# Patient Record
Sex: Female | Born: 1970 | Race: Asian | Hispanic: No | Marital: Married | Smoking: Never smoker
Health system: Southern US, Community
[De-identification: ages and names within clinical notes are randomized; demographics above are authoritative.]

## PROBLEM LIST (undated history)

## (undated) DIAGNOSIS — G8929 Other chronic pain: Secondary | ICD-10-CM

## (undated) DIAGNOSIS — R0789 Other chest pain: Secondary | ICD-10-CM

## (undated) DIAGNOSIS — K649 Unspecified hemorrhoids: Secondary | ICD-10-CM

## (undated) HISTORY — PX: TUBAL LIGATION: SHX77

## (undated) HISTORY — DX: Unspecified hemorrhoids: K64.9

## (undated) HISTORY — PX: HEMORRHOID SURGERY: SHX153

---

## 1997-10-07 ENCOUNTER — Ambulatory Visit (HOSPITAL_COMMUNITY): Admission: RE | Admit: 1997-10-07 | Discharge: 1997-10-07 | Payer: Self-pay | Admitting: *Deleted

## 2002-03-11 ENCOUNTER — Encounter: Payer: Self-pay | Admitting: Obstetrics and Gynecology

## 2002-03-11 ENCOUNTER — Ambulatory Visit (HOSPITAL_COMMUNITY): Admission: RE | Admit: 2002-03-11 | Discharge: 2002-03-11 | Payer: Self-pay | Admitting: Obstetrics and Gynecology

## 2002-03-22 ENCOUNTER — Encounter: Payer: Self-pay | Admitting: Obstetrics and Gynecology

## 2002-03-22 ENCOUNTER — Ambulatory Visit (HOSPITAL_COMMUNITY): Admission: RE | Admit: 2002-03-22 | Discharge: 2002-03-22 | Payer: Self-pay | Admitting: Obstetrics and Gynecology

## 2002-07-30 ENCOUNTER — Inpatient Hospital Stay (HOSPITAL_COMMUNITY): Admission: AD | Admit: 2002-07-30 | Discharge: 2002-08-01 | Payer: Self-pay | Admitting: Obstetrics and Gynecology

## 2004-02-02 ENCOUNTER — Other Ambulatory Visit: Admission: RE | Admit: 2004-02-02 | Discharge: 2004-02-02 | Payer: Self-pay | Admitting: Obstetrics and Gynecology

## 2004-07-18 ENCOUNTER — Inpatient Hospital Stay (HOSPITAL_COMMUNITY): Admission: AD | Admit: 2004-07-18 | Discharge: 2004-07-18 | Payer: Self-pay | Admitting: Obstetrics and Gynecology

## 2004-07-19 ENCOUNTER — Inpatient Hospital Stay (HOSPITAL_COMMUNITY): Admission: AD | Admit: 2004-07-19 | Discharge: 2004-07-21 | Payer: Self-pay | Admitting: Obstetrics and Gynecology

## 2004-07-20 ENCOUNTER — Encounter (INDEPENDENT_AMBULATORY_CARE_PROVIDER_SITE_OTHER): Payer: Self-pay | Admitting: Specialist

## 2005-10-19 ENCOUNTER — Other Ambulatory Visit: Admission: RE | Admit: 2005-10-19 | Discharge: 2005-10-19 | Payer: Self-pay | Admitting: Obstetrics and Gynecology

## 2008-07-23 ENCOUNTER — Encounter: Admission: RE | Admit: 2008-07-23 | Discharge: 2008-07-23 | Payer: Self-pay | Admitting: Obstetrics and Gynecology

## 2009-11-11 ENCOUNTER — Encounter: Admission: RE | Admit: 2009-11-11 | Discharge: 2009-11-11 | Payer: Self-pay | Admitting: Obstetrics and Gynecology

## 2010-07-02 NOTE — Discharge Summary (Signed)
[name], [name]                        ACCOUNT NO.:  [id number]   MEDICAL RECORD NO.:  [id number]                   PATIENT TYPE:  INP   LOCATION:  9115                                 [facility]   PHYSICIAN:  [name], M.D.              DATE OF BIRTH:  02/23/1970   DATE OF ADMISSION:  07/30/2002  DATE OF DISCHARGE:  08/01/2002                                 DISCHARGE SUMMARY   HISTORY AND HOSPITAL COURSE:  A 40 year old Asian married female, para 2-0-1-  2 gravida 4; last period October 29, 2001; [name] August 06, 2002 by dates and  August 07, 2002 by ultrasound; admitted for induction of labor.  Blood group  and type AB positive, negative antibody, nonreactive serology, rubella  immune, hepatitis B surface antigen negative, HIV declined, GC and chlamydia  negative, triple screen normal, one-hour Glucola 79, group B strep positive.  Vaginal ultrasound on January 01, 2002:  Crown-rump length 2.21 cm, eight  weeks six days; [name] August 07, 2002.  Repeat ultrasound on March 11, 2002:  Average gestational age [redacted] weeks two days; [name] August 03, 2002.  Echogenic  intracardiac focus was noted in Jenna left ventricle.  Jenna patient had some  spotting on March 22, 2002 but ultrasound showed no placental  abnormalities.  Jenna patient requested C section because of hemorrhoids with  her last delivery.  I advised her that was not an indication for C section.  On July 29, 2002 Jenna cervix was 2+ cm dilated, 70% effaced, and Jenna vertex  was at a -2.  Jenna patient accepted Jenna offer for induction.  She actually  contracted some during Jenna night prior to admission.  Her past medical  history revealed no known allergies, no operations, usual childhood  diseases.  Family history was negative.  Alcohol, tobacco, and drugs none.  Obstetric history:  In March 1992 she delivered a 6 pound 8 ounce female  infant vaginally with no complications.  In 1994 a spontaneous abortion.  In  1996 a 7 pound 8  ounce female vaginally with a fast labor.  Physical exam on  admission revealed normal vital signs.  Jenna abdomen was soft, fundal height  38 cm.  On July 29, 2002 fetal heart tones were normal.  Jenna cervix was 3+  cm, 80%, vertex at a -2.  Artificial rupture of Jenna membranes produced clear  fluid.  Impressions on admission:  Intrauterine pregnancy at 39 weeks,  positive group B strep.  Jenna patient was placed on penicillin.  She never  received Pitocin but entered active labor.  She received an epidural and  reached 10 cm dilatation, vertex, ROA.  She pushed well and delivered  spontaneously ROA over an intact perineum, a living female infant, 6 pounds 15  ounces, Apgars of 8 at one and 9 at five minutes.  [name] was in  attendance.  Jenna placenta was intact,  uterus normal.  There were two loops  of nuchal cord.  Blood loss about 400 mL.  Postpartum Jenna patient did well  and was discharged on postpartum day #2.  Initial hemoglobin 11.9;  hematocrit 35.6; white count 10,700; platelet count 184,000.  Follow-up  hemoglobin 10.3; hematocrit 30.8.  RPR was nonreactive.   FINAL DIAGNOSIS:  Intrauterine pregnancy at 39 weeks delivered right occiput  anterior.   OPERATION:  Spontaneous delivery ROA.   FINAL CONDITION:  Improved.   INSTRUCTIONS:  1. Include our regular discharge instruction booklet.  2. Jenna patient is advised to return to Jenna office in six weeks.  She is to     call with any problems.  3. Percocet 5/325 #20 tablets q.4-6h. as needed for pain is given at     discharge.                                               [name], M.D.   TFH/MEDQ  D:  08/01/2002  T:  08/01/2002  Job:  [id number]

## 2010-07-02 NOTE — Discharge Summary (Signed)
NAMEJEFFREY, VOTH              ACCOUNT NO.:  [id number]   MEDICAL RECORD NO.:  [id number]          PATIENT TYPE:  INP   LOCATION:  9126                          [facility]   PHYSICIAN:  Huel Cote, M.D. DATE OF BIRTH:  03-28-1970   DATE OF ADMISSION:  07/19/2004  DATE OF DISCHARGE:                                 DISCHARGE SUMMARY   DISCHARGE DIAGNOSES:  1.  Term pregnancy at 37 and five-sevenths weeks, delivered.  2.  Induction of labor given severe hemorrhoid pain.  3.  Status post normal spontaneous vaginal delivery.  4.  Status post postpartum tubal ligation.   DISCHARGE MEDICATIONS:  1.  Percocet one to two tablets p.o. q.4h. p.r.n.  2.  Motrin 600 milligrams p.o. q.6 h.  3.  Colace 100 milligrams p.o. b.i.d.  4.  Other medicines and ointments as given by Dr. Derrell Lolling for her      hemorrhoids.   PRENATAL AND HOSPITAL COURSE:  The patient is a 40 year old G5 P 3-0-1-3 who  was admitted at 49 and five-sevenths weeks for induction of labor given  ongoing issues with severe hemorrhoid pain. The patient had been followed by  general surgery which had provided all the possible palliative care they  could and had no other contributions to make for the patient. The patient  was well dated and favorable and therefore it was felt to ameliorate her  issues with severe hemorrhoids we would induce labor to provide relief.  Prenatal care was complicated only advanced maternal age with a normal  triple screen and ultrasound. The patient declined amniocentesis. She also  desired a tubal ligation after delivery. Prenatal labs were as follows:  AB  positive, antibody negative, RPR nonreactive, rubella immune, hepatitis B  surface antigen negative, HIV negative, GC negative, chlamydia negative,  group B strep negative, 1-hour Glucola 56.   PAST OBSTETRICAL HISTORY:  In 1992 she had a 6-pound 8-ounce vaginal  delivery. In 1994 she had spontaneous loss. In 1996 she had a 7-pound  8-  ounce vaginal delivery. In 2004 she had a 6-pound 15-ounce vaginal delivery.   PAST GYNECOLOGICAL HISTORY:  None.   PAST SURGICAL HISTORY:  None.   PAST MEDICAL HISTORY:  Significant only for her severe hemorrhoids which  were problematic last pregnancy and for which she had seen Dr. Orson Slick with  several I&D's of thrombosed hemorrhoids this pregnancy.   On admission, she was afebrile with stable vital signs. Fetal heart rate was  reactive. Cervix was 50 and 5+ centimeters dilated and a -2 station. She had  rupture of membranes performed with clear fluid noted and was placed on  Pitocin. The patient received an epidural early given her issues with  hemorrhoids, and progressed to complete dilation. She pushed for  approximately 30 minutes bringing the vertex to +2 station. The fetal lie  was noted be ROP and the patient was making slow progress with her pushing.  Fetal heart rate did have moderate decelerations to the 70s with each push  which had some delayed recovery and secondary to this, as well as her issue  of  severe hemorrhoids and the desire to not exacerbate her problem, she was  counseled and we decided to proceed with a a vacuum-assisted vaginal  delivery. She was delivered with an Mityvac soft cup vacuum without  difficulty and had a viable female infant, Apgars were 8 and 9, weight was 5  pounds 14 ounces. The placenta delivered spontaneously. Cervix, perineum,  vagina and rectum were all intact. Hemorrhoids were still prevalent but were  unchanged after delivery. The patient desired to proceed with her tubal  ligation and therefore this was posted for postpartum day #1. She underwent  that procedure without difficulty and was then remained in-house an  additional day. On postpartum day #2 she was doing very well. Her  hemorrhoids were stable and she was managing them with her p.o. pain  medications and ointment. Her incision pain was minimal and her incision was  clear  from her tubal ligation. Postpartum hemoglobin was 10.3. She had no  other issues and therefore was felt stable for discharge home. She was  discharged with Percocet and Colace, as well as some ointment prescriptions  from Dr. Derrell Lolling and Dr. Orson Slick, and will follow up with them regarding her  hemorrhoid issues.       KR/MEDQ  D:  07/21/2004  T:  07/21/2004  Job:  [id number]

## 2010-07-02 NOTE — Consult Note (Signed)
[name], [name]              ACCOUNT NO.:  [id number]   MEDICAL RECORD NO.:  [id number]          PATIENT TYPE:  MAT   LOCATION:  MATC                          [facility]   PHYSICIAN:  Angelia Mould. Derrell Lolling, M.D.DATE OF BIRTH:  01/02/1971   DATE OF CONSULTATION:  07/18/2004  DATE OF DISCHARGE:                                   CONSULTATION   REASON FOR CONSULTATION:  Hemorrhoids.   HISTORY OF PRESENT ILLNESS:  This is a 40 year old [place] female with  history of multiparity.  She is about [redacted] weeks pregnant.  She came to the  [facility] maternity admission area this morning complaining of  hemorrhoids.  Apparently she has had problems with her hemorrhoids during  the third trimester of her pregnancy.  She saw Dr. Lebron Conners last week  in the office and apparently had some thrombosed hemorrhoids and underwent  incision and drainage.  She still has pain.  She has not been having a bowel  movement intentionally.  She is intentionally avoiding eating to avoid  having a bowel movement.  She has intentionally withheld bowel movements for  fear that it will cause pain.  She really has not had any vomiting.  She  does not have any abdominal pain.  She does not have any voiding problems.  She has not had any bleeding.   She was seen by Dr. Tracey Harries this morning and he asked if I could do  anything to try to give her some relief and so I came to the maternity  admission unit for examination.   PAST MEDICAL HISTORY:  Apparently she has had two prior pregnancies and this  is her third.  She is otherwise healthy.  She had the usual childhood  diseases.  She has not had any operations.  No other hospitalizations.   CURRENT MEDICATIONS:  None.   ALLERGIES:  None known.   REVIEW OF SYSTEMS:  Also systems were reviewed.  They are noncontributory  except as described above.   PHYSICAL EXAMINATION:  GENERAL:  Pleasant, young, [place] female in mild to  moderate distress.   She is cooperative and oriented.  VITAL SIGNS:  Temperature 98, pulse 90, respirations 18, blood pressure  120/61.  NECK:  No adenopathy or masses.  HEART:  Regular rate and rhythm, no murmur.  Radial and femoral pulses are  palpable.  No peripheral edema.  LUNGS:  Clear to auscultation.  No chest wall tenderness.  ABDOMEN:  Soft, nontender.  Gravid.  Bowel sounds present.  RECTAL:  The patient has stage IV internal and external hemorrhoids.  There  are some wounds suggesting recent incision and drainage of thrombosed  hemorrhoids but there is no significant thrombosis at this time.  Mostly  just edema.  There are no signs of any infection or cellulitis.  There is no  active bleeding.  Digital rectal exam reveals a normal sphincter tone and  some hard stool up above.   TREATMENT:  After giving the patient informed consent, the perianal area was  prepped with alcohol.  I injected 1% Xylocaine with epinephrine (9 mL)  mixed  with Wydase (1 mL) into the internal hemorrhoid component and some Xylocaine  into the external component.  With massage, this allowed significant  reduction in the edema and I was able to achieve complete reduction of both  the internal and external component.  She tolerated this very well and had  complete relief of pain.   ASSESSMENT:  1.  Stage IV hemorrhoids, injected today.  2.  History of thrombosed hemorrhoids.  3.  Third trimester intrauterine pregnancy.   PLAN:  1.  The patient will be discharged home.  2.  She will be advised to stay off her feet mostly with bed test with      bathroom privileges to reduce the venous tone in her hemorrhoids.  3.  She will be given MiraLax 17 g in the ER and she will be given a      prescription for MiraLax to take 17 g a day dissolved in water for seven      days to correct her constipation.   She will be asked to return to see [place] p.r.n. for further hemorrhoid  problems.       HMI/MEDQ  D:  07/18/2004  T:   07/19/2004  Job:  [id number]   cc:   Malachi Pro. Ambrose Mantle, M.D.  [address]  [address]  [place]  [place] [address]  Fax: [phone]

## 2010-07-02 NOTE — Op Note (Signed)
NAMEJUAN, [name]              ACCOUNT NO.:  [id number]   MEDICAL RECORD NO.:  [id number]          PATIENT TYPE:  INP   LOCATION:  9126                          [facility]   PHYSICIAN:  Huel Cote, M.D. DATE OF BIRTH:  11-08-1970   DATE OF PROCEDURE:  07/20/2004  DATE OF DISCHARGE:                                 OPERATIVE REPORT   PREOPERATIVE DIAGNOSES:  1.  Desires sterility.  2.  Status post normal spontaneous vaginal delivery.   POSTOPERATIVE DIAGNOSES:  1.  Desires sterility.  2.  Status post normal spontaneous vaginal delivery.   PROCEDURE:  Postpartum bilateral tubal ligation.   SURGEON:  Dr. Huel Cote.   ANESTHESIA:  Epidural.   FINDINGS:  There is a normal uterus, ovaries and tubes noted.  The patient  did have a very full bladder at the procedure, which was straight  catheterized afterwards for 1 L.   ESTIMATED BLOOD LOSS:  Minimal.   PROCEDURE:  The patient was taken to the operating room, where epidural  anesthesia was found to be adequate by Allis clamp test.  She was then  prepped and draped in the normal sterile fashion in the dorsal supine  position.  An infraumbilical incision was just made with the scalpel and  then carried through to the underlying layer of fascia by sharp dissection  and the fascia opened.  The peritoneal cavity was then entered bluntly.  The  [employer] retractors were placed within the incision and a very full bladder  noted, which was approaching the umbilicus.  This was deviated to the side  and the left fallopian tube identified, traced out to its fimbriated end and  grasped with Babcock clamp.  An approximately 2-3 cm segment of tube was  then tied off with two free ties of 0 plain and the tubal portion amputated.  The free pedicles were then additionally cauterized with Bovie cautery.  The  ovary on that side appeared normal.  Attention was then turned to the right  fallopian tube, which in a similar fashion  was identified, traced out to its  fimbriated, and an approximately 2-3 cm segment of tube tied off with two  free ties of 0 plain.  This was then amputated and the free pedicles also  treated with Bovie cautery.  This was found  to be hemostatic and thus  returned to the abdomen.  There was no active bleeding noted.  Therefore,  the fascia was closed with 0 Vicryl in a  running fashion and the skin was closed with 3-0 Vicryl in a subcuticular  stitch.  Sponge, lap and needle counts were correct x2, and the patient was  taken to the recovery room in stable condition.  She was straight  catheterized prior to leaving the OR of approximately 1 L of urine.       KR/MEDQ  D:  07/20/2004  T:  07/20/2004  Job:  [id number]

## 2010-07-02 NOTE — Op Note (Signed)
[name], [name]              ACCOUNT NO.:  [id number]   MEDICAL RECORD NO.:  [id number]          PATIENT TYPE:  INP   LOCATION:  9126                          [facility]   PHYSICIAN:  Huel Cote, M.D. DATE OF BIRTH:  17-Apr-1970   DATE OF PROCEDURE:  07/19/2004  DATE OF DISCHARGE:                                 OPERATIVE REPORT   INDICATIONS FOR PROCEDURE:  The patient is a 40 year old gravida 4, para 3,  who was induced, given an ongoing problem with grade IV hemorrhoids and  obtaining really no relief with general surgeons having no other palliative  care to offer.  She was induced at approximately [redacted] weeks gestation and had  a very favorable cervix.  She reached complete dilation and pushed for  approximately 30 minutes and brought the vertex to a +2 station.  Presentation was ROP.  At this point, the fetal heart rate had moderate  decelerations to the 70's with each push and there was some delay in  recovery with the decelerations over time.  Also we had a prearranged plan  to try to diminish her pushing time given her severe hemorrhoids in hopes  that this would not exacerbate her problem.  At this point, the patient was  counseled and the decision was made to proceed with a vacuum assisted  vaginal delivery.   DESCRIPTION OF PROCEDURE:  The Mity-Vac soft cup vacuum was applied to the  vertex at a +2 position, ROP presentation.  One popoff occurred after the  first application and the vacuum was reapplied.  The baby then delivered  with two easy pulls over intact perineum.  Apgars were 8 and 9.  Weight was  pending after delivery.  The placenta delivered spontaneously.  The baby was  noted to be a direct ROP presentation on delivery.  Cervix, perineum,  vagina, and rectum were all intact.  Her hemorrhoids were still prevalent  after delivery, but appeared unchanged and she was left to recover in the  LDR room in stable condition.       KR/MEDQ  D:   07/19/2004  T:  07/19/2004  Job:  [id number]

## 2011-06-09 ENCOUNTER — Other Ambulatory Visit: Payer: Self-pay | Admitting: Obstetrics and Gynecology

## 2011-06-09 DIAGNOSIS — Z1231 Encounter for screening mammogram for malignant neoplasm of breast: Secondary | ICD-10-CM

## 2011-06-22 ENCOUNTER — Ambulatory Visit
Admission: RE | Admit: 2011-06-22 | Discharge: 2011-06-22 | Disposition: A | Discharge status: Home / Self Care | Payer: BC Managed Care – PPO | Source: Ambulatory Visit | Attending: Obstetrics and Gynecology | Admitting: Obstetrics and Gynecology

## 2011-06-22 DIAGNOSIS — Z1231 Encounter for screening mammogram for malignant neoplasm of breast: Secondary | ICD-10-CM

## 2012-09-21 ENCOUNTER — Other Ambulatory Visit: Payer: Self-pay

## 2012-09-21 DIAGNOSIS — Z1231 Encounter for screening mammogram for malignant neoplasm of breast: Secondary | ICD-10-CM

## 2012-10-08 ENCOUNTER — Ambulatory Visit
Admission: RE | Admit: 2012-10-08 | Discharge: 2012-10-08 | Disposition: A | Discharge status: Home / Self Care | Payer: BC Managed Care – PPO | Source: Ambulatory Visit

## 2012-10-08 DIAGNOSIS — Z1231 Encounter for screening mammogram for malignant neoplasm of breast: Secondary | ICD-10-CM

## 2012-10-10 ENCOUNTER — Other Ambulatory Visit: Payer: Self-pay | Admitting: Family Medicine

## 2012-10-10 DIAGNOSIS — R928 Other abnormal and inconclusive findings on diagnostic imaging of breast: Secondary | ICD-10-CM

## 2012-11-05 ENCOUNTER — Ambulatory Visit
Admission: RE | Admit: 2012-11-05 | Discharge: 2012-11-05 | Disposition: A | Discharge status: Home / Self Care | Payer: BC Managed Care – PPO | Source: Ambulatory Visit | Attending: Family Medicine | Admitting: Family Medicine

## 2012-11-05 DIAGNOSIS — R928 Other abnormal and inconclusive findings on diagnostic imaging of breast: Secondary | ICD-10-CM

## 2013-05-21 ENCOUNTER — Ambulatory Visit (INDEPENDENT_AMBULATORY_CARE_PROVIDER_SITE_OTHER): Payer: BC Managed Care – PPO | Admitting: General Surgery

## 2014-03-01 IMAGING — MG MM DIAGNOSTIC LTD RIGHT
2 series · 2 of 2 positions shown · non-contrast
Comparison: Multiple priors

ACR Breast Density Category b: There is a scattered fibroglandular
pattern.

CLINICAL DATA: Abnormal screening mammogram.

EXAM:
DIGITAL DIAGNOSTIC RIGHT MAMMOGRAM with CAD

[R MLO]
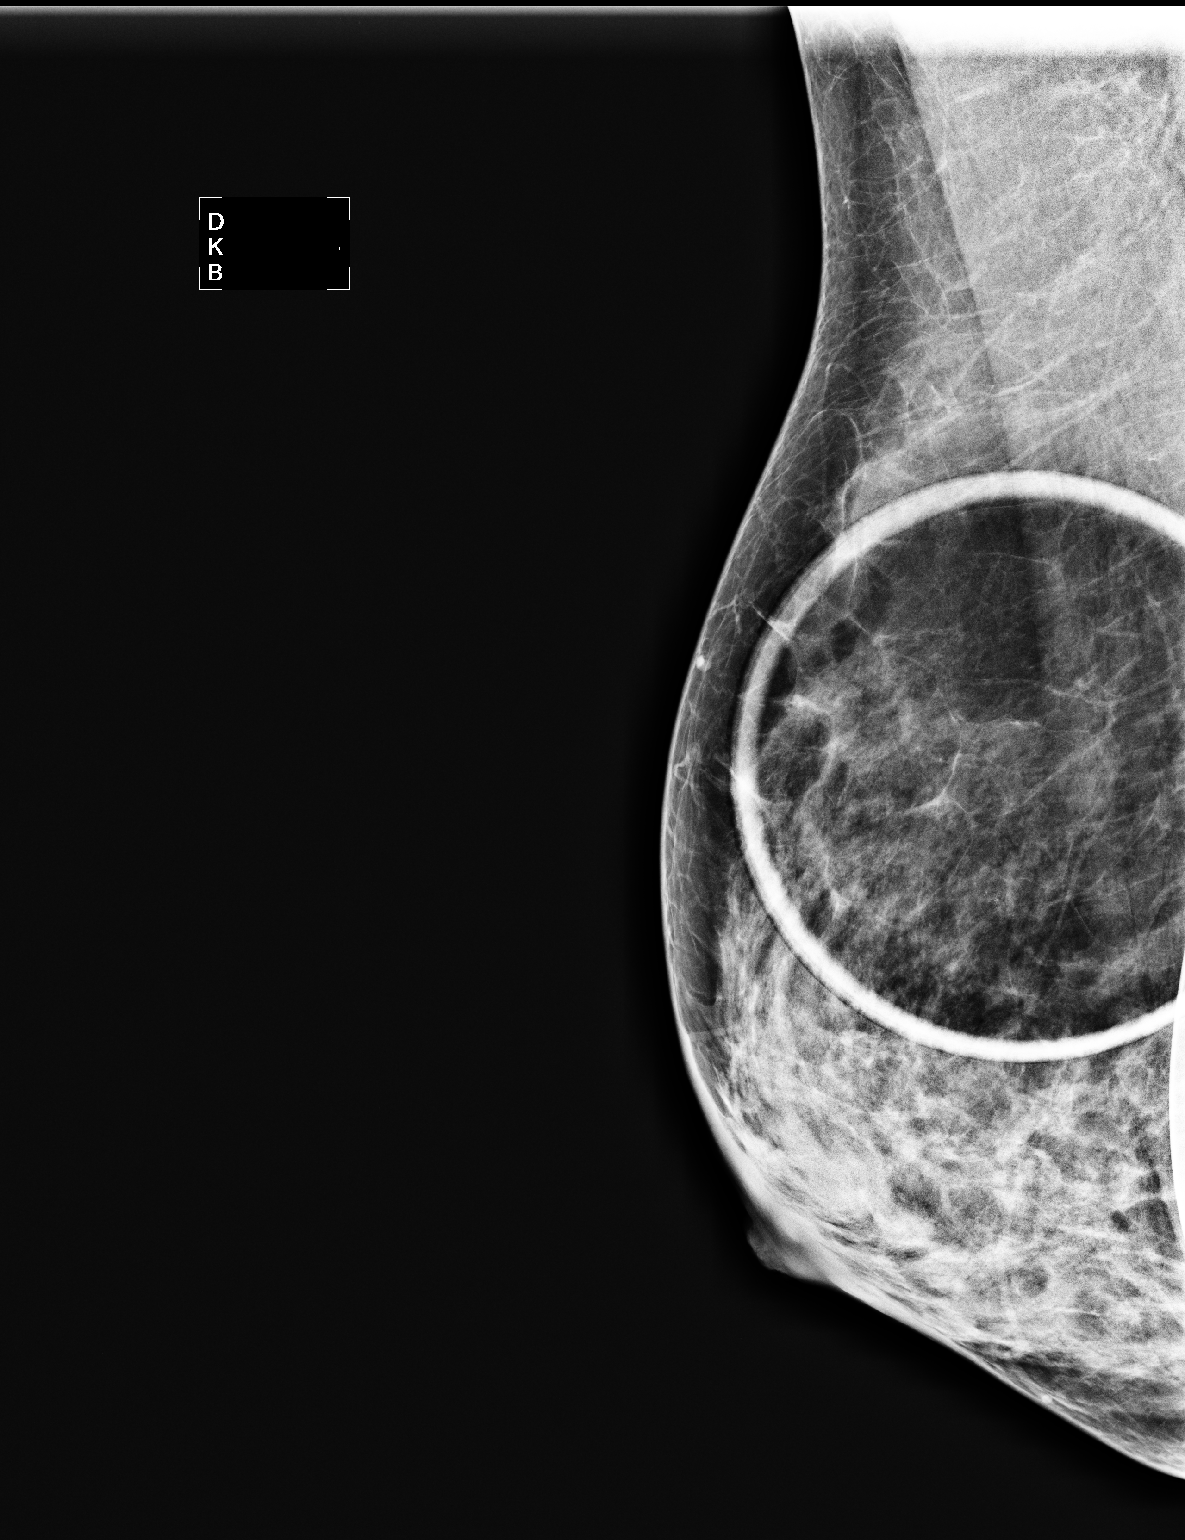

[R ML]
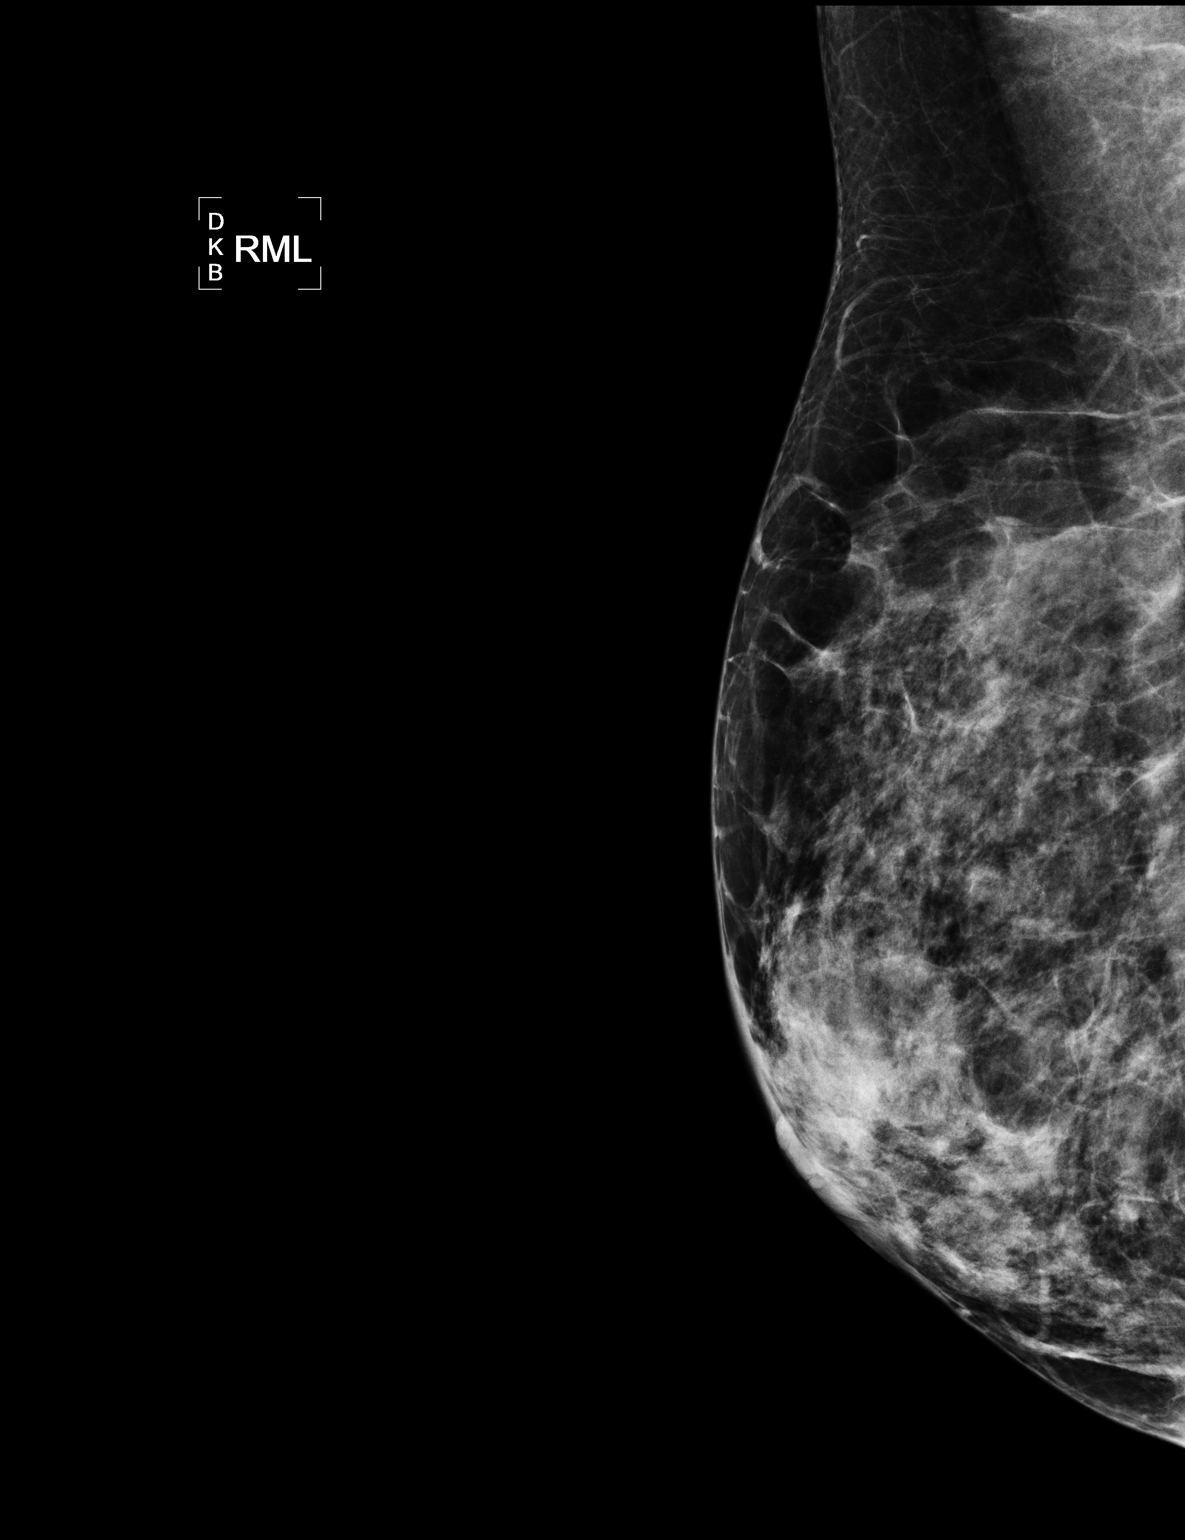

[2 of 2 positions shown; findings below may reference images not displayed]

FINDINGS: ML view in addition to a spot compression MLO view of the right
breast were obtained. The previously questioned asymmetry in the
upper right breast disperses with additional imaging.

Mammographic images were processed with CAD.
IMPRESSION: Negative examination

RECOMMENDATION:
Screening mammogram in one year.(Code:[id number]-[id number])

I have discussed the findings and recommendations with the patient.
Results were also provided in writing at the conclusion of the
visit. If applicable, a reminder letter will be sent to the patient
regarding the next appointment.

BI-RADS CATEGORY  1: Negative

## 2014-04-09 ENCOUNTER — Encounter: Payer: Self-pay | Admitting: Nurse Practitioner

## 2014-04-15 ENCOUNTER — Ambulatory Visit: Payer: Self-pay | Admitting: Nurse Practitioner

## 2014-04-16 ENCOUNTER — Encounter: Payer: Self-pay | Admitting: Gastroenterology

## 2014-04-16 ENCOUNTER — Ambulatory Visit (INDEPENDENT_AMBULATORY_CARE_PROVIDER_SITE_OTHER): Payer: Medicaid Other | Admitting: Gastroenterology

## 2014-04-16 VITALS — BP 110/76 | HR 64 | Ht 65.0 in | Wt 142.1 lb

## 2014-04-16 DIAGNOSIS — K644 Residual hemorrhoidal skin tags: Secondary | ICD-10-CM

## 2014-04-16 DIAGNOSIS — K648 Other hemorrhoids: Secondary | ICD-10-CM

## 2014-04-16 NOTE — Patient Instructions (Addendum)
You have been scheduled for an appointment with Dr.  Derrell Lollingamirez  at [facility]. Your appointment is on 04/28/14 at 2:30pm. Please arrive at 2:00pm for registration. Make certain to bring a list of current medications, including any over the counter medications or vitamins. Also bring your co-pay if you have one as well as your insurance cards. [facility] is located at [address], [address]. Should you need to reschedule your appointment, please contact them at [phone].   I appreciate the opportunity to care for you.

## 2014-04-16 NOTE — Progress Notes (Signed)
     04/16/2014 [name] [id number] [birth year]   HISTORY OF PRESENT ILLNESS:  Patient is a pleasant 44 year old [place] female who is new to our practice and is here today to discuss her hemorrhoid issues.  She says that in 02/2011 she had a hemorrhoid surgery in [place] and was having rectal bleeding from them at that time; she is not sure exactly what was performed.  She says that they seemed to be gone for a while, but now they seem to be back again and are constantly present.  She has not experienced any bleeding, however, and are just a nuisance so she would like to see about having them removed.  Denies any other GI complaints.   Past Medical History  Diagnosis Date  . Hemorrhoids    Past Surgical History  Procedure Laterality Date  . Hemorrhoid surgery    . Tubal ligation      reports that she has never smoked. She has never used smokeless tobacco. She reports that she does not drink alcohol or use illicit drugs. family history is not on file. No Known Allergies   No outpatient encounter prescriptions on file as of 04/16/2014.    REVIEW OF SYSTEMS  : All other systems reviewed and negative except where noted in the History of Present Illness.   PHYSICAL EXAM: BP 110/76 mmHg  Pulse 64  Ht 5\' 5"  (1.651 m)  Wt 142 lb 2 oz (64.467 kg)  BMI 23.65 kg/m2  LMP 04/10/2014 General: Well developed female in no acute distress Head: Normocephalic and atraumatic Eyes:  Sclerae anicteric, conjunctiva pink. Ears: Normal auditory acuity. Abdomen: Soft, non-distended.  BS present.  Non-tender. Rectal:  External hemorrhoids noted, non-inflamed and non-bleeding.  DRE did not reveal any masses; light brown, heme negative stool on exam glove.  Anoscopy did not reveal any significant internal hemorrhoids. Musculoskeletal: Symmetrical with no gross deformities  Skin: No lesions on visible extremities Extremities: No edema  Neurological: Alert oriented x 4, grossly  non-focal Psychological:  Alert and cooperative. Normal mood and affect  ASSESSMENT AND PLAN: -External hemorrhoids:  Seen on exam.  No significant internal hemorrhoids identified.  Will refer to [facility] for evaluation as patient would like to consider removal.

## 2014-04-17 NOTE — Progress Notes (Signed)
I agree with the above note, plan 

## 2016-03-13 ENCOUNTER — Emergency Department (HOSPITAL_BASED_OUTPATIENT_CLINIC_OR_DEPARTMENT_OTHER): Payer: BLUE CROSS/BLUE SHIELD

## 2016-03-13 ENCOUNTER — Emergency Department (HOSPITAL_BASED_OUTPATIENT_CLINIC_OR_DEPARTMENT_OTHER)
Admission: EM | Admit: 2016-03-13 | Discharge: 2016-03-13 | Disposition: A | Discharge status: Home / Self Care | Payer: BLUE CROSS/BLUE SHIELD | Attending: Emergency Medicine | Admitting: Emergency Medicine

## 2016-03-13 ENCOUNTER — Encounter (HOSPITAL_BASED_OUTPATIENT_CLINIC_OR_DEPARTMENT_OTHER): Payer: Self-pay | Admitting: *Deleted

## 2016-03-13 DIAGNOSIS — R0789 Other chest pain: Secondary | ICD-10-CM

## 2016-03-13 DIAGNOSIS — R072 Precordial pain: Secondary | ICD-10-CM | POA: Diagnosis present

## 2016-03-13 HISTORY — DX: Other chest pain: R07.89

## 2016-03-13 HISTORY — DX: Other chronic pain: G89.29

## 2016-03-13 LAB — COMPREHENSIVE METABOLIC PANEL
ALBUMIN: 3.9 g/dL (ref 3.5–5.0)
ALT: 68 U/L — ABNORMAL HIGH (ref 14–54)
ANION GAP: 7 (ref 5–15)
AST: 42 U/L — AB (ref 15–41)
Alkaline Phosphatase: 63 U/L (ref 38–126)
BUN: 14 mg/dL (ref 6–20)
CHLORIDE: 108 mmol/L (ref 101–111)
CO2: 25 mmol/L (ref 22–32)
Calcium: 9.1 mg/dL (ref 8.9–10.3)
Creatinine, Ser: 0.5 mg/dL (ref 0.44–1.00)
GFR calc Af Amer: >60 mL/min (ref >60)
GFR calc non Af Amer: >60 mL/min (ref >60)
GLUCOSE: 94 mg/dL (ref 65–99)
POTASSIUM: 3.9 mmol/L (ref 3.5–5.1)
SODIUM: 140 mmol/L (ref 135–145)
Total Bilirubin: 1.2 mg/dL (ref 0.3–1.2)
Total Protein: 6.7 g/dL (ref 6.5–8.1)

## 2016-03-13 LAB — CBC
HEMATOCRIT: 41.5 % (ref 36.0–46.0)
Hemoglobin: 14.1 g/dL (ref 12.0–15.0)
MCH: 29.2 pg (ref 26.0–34.0)
MCHC: 34 g/dL (ref 30.0–36.0)
MCV: 85.9 fL (ref 78.0–100.0)
PLATELETS: 247 10*3/uL (ref 150–400)
RBC: 4.83 MIL/uL (ref 3.87–5.11)
RDW: 12.3 % (ref 11.5–15.5)
WBC: 6.1 10*3/uL (ref 4.0–10.5)

## 2016-03-13 LAB — TROPONIN I: Troponin I: <0.03 ng/mL (ref <0.03)

## 2016-03-13 MED ORDER — IBUPROFEN 400 MG PO TABS
600.0000 mg | ORAL_TABLET | Freq: Once | ORAL | Status: AC
  Administered 2016-03-13: 600 mg via ORAL
  Filled 2016-03-13: qty 1

## 2016-03-13 MED ORDER — IBUPROFEN 600 MG PO TABS: 600.0000 mg | ORAL_TABLET | Freq: Four times a day (QID) | ORAL | 0 refills | Status: AC | PRN

## 2016-03-13 NOTE — ED Notes (Signed)
Pt on cardiac monitor, continuous pulse ox and auto VS 

## 2016-03-13 NOTE — ED Notes (Signed)
Pt in X-Ray ?

## 2016-03-13 NOTE — ED Triage Notes (Signed)
Patient states she developed central chest pain with radiation into the upper back.  Pain is associated with headache.

## 2016-03-13 NOTE — ED Provider Notes (Signed)
[facility] Provider Note   CSN: [id number] Arrival date & time: 03/13/16  1218     History   Chief Complaint Chief Complaint  Patient presents with  . Chest Pain    HPI [name] is a 46 y.o. female.  The history is provided by the patient.  Chest Pain   This is a recurrent problem. The problem occurs constantly. The problem has not changed since onset.Associated with: deep breathing. The pain is present in the substernal region. The pain is mild. The quality of the pain is described as pressure-like. The pain radiates to the upper back. Duration of episode(s) is 2 days. The symptoms are aggravated by deep breathing. Pertinent negatives include no abdominal pain, no cough, no diaphoresis, no dizziness, no exertional chest pressure, no fever and no shortness of breath. Treatments tried: ibuprofen. The treatment provided moderate relief. There are no known risk factors.    Past Medical History:  Diagnosis Date  . Chest wall pain, chronic   . Hemorrhoids     Patient Active Problem List   Diagnosis Date Noted  . External hemorrhoids 04/16/2014    Past Surgical History:  Procedure Laterality Date  . HEMORRHOID SURGERY    . TUBAL LIGATION      OB History    No data available       Home Medications    Prior to Admission medications   Not on File    Family History No family history on file.  Social History Social History  Substance Use Topics  . Smoking status: Never Smoker  . Smokeless tobacco: Never Used  . Alcohol use No     Allergies   Patient has no known allergies.   Review of Systems Review of Systems  Constitutional: Negative for diaphoresis and fever.  Respiratory: Negative for cough and shortness of breath.   Cardiovascular: Positive for chest pain.  Gastrointestinal: Negative for abdominal pain.  Neurological: Negative for dizziness.     Physical Exam Updated Vital Signs BP 130/85   Pulse 107   Resp 18   Ht 5'  5" (1.651 m)   Wt 137 lb (62.1 kg)   LMP 03/06/2016 (Exact Date)   SpO2 96%   BMI 22.80 kg/m   Physical Exam  Constitutional: She is oriented to person, place, and time. She appears well-developed and well-nourished. No distress.  HENT:  Head: Normocephalic.  Nose: Nose normal.  Eyes: Conjunctivae are normal.  Neck: Neck supple. No tracheal deviation present.  Cardiovascular: Normal rate, regular rhythm and normal heart sounds.   Pulmonary/Chest: Effort normal and breath sounds normal. No respiratory distress. She has no wheezes. She has no rales. She exhibits tenderness (anterior).  Abdominal: Soft. She exhibits no distension. There is no tenderness. There is no guarding.  Neurological: She is alert and oriented to person, place, and time.  Skin: Skin is warm and dry. Capillary refill takes less than 2 seconds.  Psychiatric: She has a normal mood and affect.  Vitals reviewed.    ED Treatments / Results  Labs (all labs ordered are listed, but only abnormal results are displayed) Labs Reviewed  COMPREHENSIVE METABOLIC PANEL - Abnormal; Notable for the following:       Result Value   AST 42 (*)    ALT 68 (*)    All other components within normal limits  CBC  TROPONIN I    EKG  EKG Interpretation  Date/Time:  Sunday March 13 2016 12:25:55 EST Ventricular Rate:  111 PR Interval:  114 QRS Duration: 78 QT Interval:  322 QTC Calculation: 437 R Axis:   105 Text Interpretation:  Sinus tachycardia Rightward axis No previous tracing Confirmed by Raela Bohl MD, Zeva Leber [phone]([id number]) on 03/13/2016 1:51:28 PM       Radiology Dg Chest 2 View  Result Date: 03/13/2016 CLINICAL DATA:  Subdural chest pain. EXAM: CHEST  2 VIEW COMPARISON:  None. FINDINGS: The heart size and mediastinal contours are within normal limits. Both lungs are clear. The visualized skeletal structures are unremarkable. IMPRESSION: No active cardiopulmonary disease. Electronically Signed   By: Ted Mcalpineobrinka  Dimitrova M.D.    On: 03/13/2016 13:31    Procedures Procedures (including critical care time)  Medications Ordered in ED Medications  ibuprofen (ADVIL,MOTRIN) tablet 600 mg (not administered)     Initial Impression / Assessment and Plan / ED Course  I have reviewed the triage vital signs and the nursing notes.  Pertinent labs & imaging results that were available during my care of the patient were reviewed by me and considered in my medical decision making (see chart for details).     46 year old female presents with sternal chest pain that is reproducible on exam and atypical in nature. Worse by taking a deep breath. No signs or risk factors for PE. Has strong family history but she is a young age and low risk by heart score with negative troponin and an unremarkable EKG. Discussed supportive care with ibuprofen which seems to have worked previously during this course of illness. She attempted to go to urgent care who sent her here for cardiac evaluation which is reassuring for lack of emergent etiology currently.  Plan to follow up with PCP as needed and return precautions discussed for worsening or new concerning symptoms.  Final Clinical Impressions(s) / ED Diagnoses   Final diagnoses:  Chest wall pain    New Prescriptions New Prescriptions   IBUPROFEN (ADVIL,MOTRIN) 600 MG TABLET    Take 1 tablet (600 mg total) by mouth every 6 (six) hours as needed for moderate pain.     Lyndal Pulleyaniel Jeanna Giuffre, MD 03/13/16 1409

## 2016-08-04 ENCOUNTER — Encounter (HOSPITAL_BASED_OUTPATIENT_CLINIC_OR_DEPARTMENT_OTHER): Payer: Self-pay | Admitting: *Deleted

## 2016-08-04 ENCOUNTER — Emergency Department (HOSPITAL_BASED_OUTPATIENT_CLINIC_OR_DEPARTMENT_OTHER)
Admission: EM | Admit: 2016-08-04 | Discharge: 2016-08-04 | Disposition: A | Discharge status: Home / Self Care | Payer: BLUE CROSS/BLUE SHIELD | Attending: Emergency Medicine | Admitting: Emergency Medicine

## 2016-08-04 ENCOUNTER — Emergency Department (HOSPITAL_BASED_OUTPATIENT_CLINIC_OR_DEPARTMENT_OTHER): Payer: BLUE CROSS/BLUE SHIELD

## 2016-08-04 DIAGNOSIS — I4891 Unspecified atrial fibrillation: Secondary | ICD-10-CM | POA: Diagnosis not present

## 2016-08-04 DIAGNOSIS — I480 Paroxysmal atrial fibrillation: Secondary | ICD-10-CM

## 2016-08-04 DIAGNOSIS — R002 Palpitations: Secondary | ICD-10-CM | POA: Diagnosis present

## 2016-08-04 LAB — COMPREHENSIVE METABOLIC PANEL
ALBUMIN: 4.1 g/dL (ref 3.5–5.0)
ALK PHOS: 86 U/L (ref 38–126)
ALT: 22 U/L (ref 14–54)
ANION GAP: 10 (ref 5–15)
AST: 22 U/L (ref 15–41)
BILIRUBIN TOTAL: 0.7 mg/dL (ref 0.3–1.2)
BUN: 11 mg/dL (ref 6–20)
CALCIUM: 9.2 mg/dL (ref 8.9–10.3)
CO2: 24 mmol/L (ref 22–32)
CREATININE: 0.41 mg/dL — AB (ref 0.44–1.00)
Chloride: 103 mmol/L (ref 101–111)
GFR calc non Af Amer: >60 mL/min (ref >60)
GLUCOSE: 134 mg/dL — AB (ref 65–99)
Potassium: 3.7 mmol/L (ref 3.5–5.1)
Sodium: 137 mmol/L (ref 135–145)
TOTAL PROTEIN: 6.8 g/dL (ref 6.5–8.1)

## 2016-08-04 LAB — CBC WITH DIFFERENTIAL/PLATELET
BASOS ABS: 0 10*3/uL (ref 0.0–0.1)
BASOS PCT: 0 %
EOS ABS: 0.1 10*3/uL (ref 0.0–0.7)
EOS PCT: 1 %
HCT: 40.7 % (ref 36.0–46.0)
Hemoglobin: 13.9 g/dL (ref 12.0–15.0)
Lymphocytes Relative: 31 %
Lymphs Abs: 2.6 10*3/uL (ref 0.7–4.0)
MCH: 28.4 pg (ref 26.0–34.0)
MCHC: 34.2 g/dL (ref 30.0–36.0)
MCV: 83.1 fL (ref 78.0–100.0)
MONO ABS: 0.9 10*3/uL (ref 0.1–1.0)
MONOS PCT: 11 %
Neutro Abs: 4.9 10*3/uL (ref 1.7–7.7)
Neutrophils Relative %: 57 %
PLATELETS: 285 10*3/uL (ref 150–400)
RBC: 4.9 MIL/uL (ref 3.87–5.11)
RDW: 13.5 % (ref 11.5–15.5)
WBC: 8.6 10*3/uL (ref 4.0–10.5)

## 2016-08-04 MED ORDER — METOPROLOL TARTRATE 50 MG PO TABS: 25.0000 mg | ORAL_TABLET | Freq: Two times a day (BID) | ORAL | 0 refills | Status: AC

## 2016-08-04 MED ORDER — ASPIRIN EC 81 MG PO TBEC: 81.0000 mg | DELAYED_RELEASE_TABLET | Freq: Every day | ORAL | 1 refills | Status: AC

## 2016-08-04 MED ORDER — ACETAMINOPHEN 325 MG PO TABS
650.0000 mg | ORAL_TABLET | Freq: Once | ORAL | Status: AC
  Administered 2016-08-04: 650 mg via ORAL
  Filled 2016-08-04: qty 2

## 2016-08-04 MED ORDER — LACTATED RINGERS IV BOLUS (SEPSIS)
1000.0000 mL | Freq: Once | INTRAVENOUS | Status: AC
  Administered 2016-08-04: 1000 mL via INTRAVENOUS

## 2016-08-04 MED ORDER — ASPIRIN 81 MG PO CHEW
81.0000 mg | CHEWABLE_TABLET | Freq: Once | ORAL | Status: AC
  Administered 2016-08-04: 81 mg via ORAL
  Filled 2016-08-04: qty 1

## 2016-08-04 MED ORDER — METOPROLOL TARTRATE 5 MG/5ML IV SOLN
5.0000 mg | Freq: Once | INTRAVENOUS | Status: AC
  Administered 2016-08-04: 5 mg via INTRAVENOUS
  Filled 2016-08-04: qty 5

## 2016-08-04 NOTE — ED Provider Notes (Signed)
[facility]   CSN: [id number] Arrival date & time: 08/04/16  1626  By signing my name below, I, Freida Busman, attest that this documentation has been prepared under the direction and in the presence of Miia Blanks, Barbara Cower, MD . Electronically Signed: Freida Busman, Scribe. 08/04/2016. 6:35 PM.  History   Chief Complaint Chief Complaint  Patient presents with  . Palpitations    The history is provided by the patient. The history is limited by a language barrier. No language interpreter was used.     HPI Comments:  [name] is a 46 y.o. female who presents to the Emergency Department complaining of intermittent episodes of palpitations with associated SOB x 1 week. She notes episode today was longer, states past episodes last ~ 1hr but palpitations have lasted almost all day today. Pt went to see her PCP at [facility] due to the persistence of her symptom. She was in AFIB with RVR on EKG and was sent to ED for further evaluation.  Pt denies h/o similar palpitations, AFIB, or cardiac issues. She notes intermittent HA, fatigue, occassional diaphoresis, and mild neck swelling. She states she has had the swelling in her neck since she was younger but has noticed a recent increase in swelling. No leg swelling, abdominal pain, or fever. No alleviating factors noted. Pt also notes stressful event with her cousin ~ 1 week ago.    Past Medical History:  Diagnosis Date  . Chest wall pain, chronic   . Hemorrhoids     Patient Active Problem List   Diagnosis Date Noted  . External hemorrhoids 04/16/2014    Past Surgical History:  Procedure Laterality Date  . HEMORRHOID SURGERY    . TUBAL LIGATION      OB History    No data available       Home Medications    Prior to Admission medications   Medication Sig Start Date End Date Taking? Authorizing Provider  aspirin EC 81 MG tablet Take 1 tablet (81 mg total) by mouth daily. 08/04/16   Harrie Cazarez, Barbara Cower, MD    ibuprofen (ADVIL,MOTRIN) 600 MG tablet Take 1 tablet (600 mg total) by mouth every 6 (six) hours as needed for moderate pain. 03/13/16   Lyndal Pulley, MD  metoprolol tartrate (LOPRESSOR) 50 MG tablet Take 0.5 tablets (25 mg total) by mouth 2 (two) times daily. May take one extra daily as needed for palpitations longer than one hour. 08/04/16   Tregan Read, Barbara Cower, MD    Family History History reviewed. No pertinent family history.  Social History Social History  Substance Use Topics  . Smoking status: Never Smoker  . Smokeless tobacco: Never Used  . Alcohol use No     Allergies   Patient has no known allergies.   Review of Systems Review of Systems  Constitutional: Positive for diaphoresis and fatigue. Negative for chills and fever.  Respiratory: Positive for shortness of breath.   Cardiovascular: Positive for palpitations. Negative for chest pain and leg swelling.  Gastrointestinal: Negative for abdominal pain.  Musculoskeletal:       +Neck swelling (chronic)  Neurological: Positive for headaches.  All other systems reviewed and are negative.    Physical Exam Updated Vital Signs BP 121/60 Comment: Simultaneous filing. User may not have seen previous data.  Pulse 91 Comment: Simultaneous filing. User may not have seen previous data.  Temp 98.2 F (36.8 C)   Resp 16 Comment: Simultaneous filing. User may not have seen previous data.  Ht  5\' 5"  (1.651 m)   Wt 63 kg (139 lb)   LMP 07/07/2016   SpO2 99% Comment: Simultaneous filing. User may not have seen previous data.  BMI 23.13 kg/m   Physical Exam  Constitutional: She is oriented to person, place, and time. She appears well-developed and well-nourished. No distress.  HENT:  Head: Normocephalic and atraumatic.  Eyes: Conjunctivae are normal.  Neck:  Minimal fullness in the neck  Cardiovascular: Regular rhythm.  Tachycardia present.   Pulses:      Radial pulses are 2+ on the right side, and 2+ on the left side.   Pulmonary/Chest: Effort normal and breath sounds normal. No respiratory distress.  Abdominal: Soft. She exhibits no distension. There is no tenderness.  Lymphadenopathy:    She has no cervical adenopathy.  Neurological: She is alert and oriented to person, place, and time.  Skin: Skin is warm and dry.  Psychiatric: She has a normal mood and affect.  Nursing note and vitals reviewed.    ED Treatments / Results  DIAGNOSTIC STUDIES:  Oxygen Saturation is 96% on RA, normal by my interpretation.    COORDINATION OF CARE:  6:35 PM Discussed treatment plan with pt at bedside and pt agreed to plan.  Labs (all labs ordered are listed, but only abnormal results are displayed) Labs Reviewed  COMPREHENSIVE METABOLIC PANEL - Abnormal; Notable for the following:       Result Value   Glucose, Bld 134 (*)    Creatinine, Ser 0.41 (*)    All other components within normal limits  TSH - Abnormal; Notable for the following:    TSH <0.010 (*)    All other components within normal limits  CBC WITH DIFFERENTIAL/PLATELET  T4, FREE    EKG  EKG Interpretation  Date/Time:  Thursday August 04 2016 16:30:58 EDT Ventricular Rate:  121 PR Interval:  112 QRS Duration: 84 QT Interval:  294 QTC Calculation: 417 R Axis:   95 Text Interpretation:  Sinus tachycardia Rightward axis Borderline ECG No significant change since last tracing Confirmed by Marily MemosMesner, Janara Klett [phone]([id number]) on 08/04/2016 4:43:42 PM          Radiology Dg Chest 2 View  Result Date: 08/04/2016 CLINICAL DATA:  Chest discomfort palpitations today EXAM: CHEST  2 VIEW COMPARISON:  March 13, 2016 FINDINGS: The heart size and mediastinal contours are within normal limits. Both lungs are clear. The visualized skeletal structures are unremarkable. IMPRESSION: No active cardiopulmonary disease. Electronically Signed   By: Sherian ReinWei-Chen  Lin M.D.   On: 08/04/2016 19:20    Procedures Procedures (including critical care time)  Medications Ordered  in ED Medications  lactated ringers bolus 1,000 mL (0 mLs Intravenous Stopped 08/04/16 2001)  acetaminophen (TYLENOL) tablet 650 mg (650 mg Oral Given 08/04/16 2005)  metoprolol tartrate (LOPRESSOR) injection 5 mg (5 mg Intravenous Given 08/04/16 2025)  aspirin chewable tablet 81 mg (81 mg Oral Given 08/04/16 2025)     Initial Impression / Assessment and Plan / ED Course  I have reviewed the triage vital signs and the nursing notes.  Pertinent labs & imaging results that were available during my care of the patient were reviewed by me and considered in my medical decision making (see chart for details).    CHA2DS2/VAS Stroke Risk Points      1 >= 2 Points: High Risk  1 - 1.99 Points: Medium Risk  0 Points: Low Risk    The patient's score has not changed in the past year.:  No  Change        Details    Note: External data might be a factor in metrics not marked with    Points Metrics   This score determines the patient's risk of having a stroke if the  patient has atrial fibrillation.       0 Has Congestive Heart Failure:  No   0 Has Vascular Disease:  No   0 Has Hypertension:  No   0 Age:  46   0 Has Diabetes:  No   0 Had Stroke:  No Had TIA:  No Had thromboembolism:  No   1 Female:  Yes      Likely paroxysmal Afib. Sinus tachycardia here, but PCP ECG (pic above) c/w Afib.   chadsvasc of 1. I discussed with cardiology on call who stated 'that doesn't count just because she is a female, only if it is with something else that is positive' and that she was low risk. He did not think she needed to be seen in Afib clinic even though her ECG clearly showed Afib w/ RVR at PCP office. I still think she needs to see a cardiologist and have some form of anticoagulation so I started her on metoprolol and aspirin and referred to cardiology.   Final Clinical Impressions(s) / ED Diagnoses   Final diagnoses:  Atrial fibrillation with RVR (HCC)  Paroxysmal atrial fibrillation (HCC)    New  Prescriptions Discharge Medication List as of 08/04/2016  8:48 PM    START taking these medications   Details  aspirin EC 81 MG tablet Take 1 tablet (81 mg total) by mouth daily., Starting Thu 08/04/2016, Print    metoprolol tartrate (LOPRESSOR) 50 MG tablet Take 0.5 tablets (25 mg total) by mouth 2 (two) times daily. May take one extra daily as needed for palpitations longer than one hour., Starting Thu 08/04/2016, Print       I personally performed the services described in this documentation, which was scribed in my presence. The recorded information has been reviewed and is accurate.     Cuthbert Turton, Barbara Cower, MD 08/05/16 [id number]

## 2016-08-04 NOTE — ED Triage Notes (Addendum)
Pt sent here from [facility] dx new onset afib  HR 160, c/o palpitations and SOB x 1 week

## 2016-08-04 NOTE — ED Notes (Signed)
[facility]

## 2016-08-05 LAB — TSH

## 2016-08-05 LAB — T4, FREE: Free T4: 4.73 ng/dL — ABNORMAL HIGH (ref 0.61–1.12)
